# Patient Record
Sex: Female | Born: 1990 | Race: Black or African American | Hispanic: No | Marital: Single | Smoking: Never smoker
Health system: Southern US, Community
[De-identification: ages and names within clinical notes are randomized; demographics above are authoritative.]

## PROBLEM LIST (undated history)

## (undated) DIAGNOSIS — N760 Acute vaginitis: Secondary | ICD-10-CM

## (undated) DIAGNOSIS — Z8742 Personal history of other diseases of the female genital tract: Secondary | ICD-10-CM

## (undated) DIAGNOSIS — B9689 Other specified bacterial agents as the cause of diseases classified elsewhere: Secondary | ICD-10-CM

## (undated) DIAGNOSIS — Z8619 Personal history of other infectious and parasitic diseases: Secondary | ICD-10-CM

## (undated) DIAGNOSIS — N926 Irregular menstruation, unspecified: Secondary | ICD-10-CM

## (undated) DIAGNOSIS — L680 Hirsutism: Secondary | ICD-10-CM

## (undated) DIAGNOSIS — N898 Other specified noninflammatory disorders of vagina: Secondary | ICD-10-CM

## (undated) DIAGNOSIS — E282 Polycystic ovarian syndrome: Secondary | ICD-10-CM

## (undated) HISTORY — DX: Polycystic ovarian syndrome: E28.2

## (undated) HISTORY — DX: Personal history of other diseases of the female genital tract: Z87.42

## (undated) HISTORY — DX: Hirsutism: L68.0

## (undated) HISTORY — DX: Other specified bacterial agents as the cause of diseases classified elsewhere: B96.89

## (undated) HISTORY — DX: Irregular menstruation, unspecified: N92.6

## (undated) HISTORY — DX: Acute vaginitis: N76.0

## (undated) HISTORY — DX: Personal history of other infectious and parasitic diseases: Z86.19

## (undated) HISTORY — DX: Other specified noninflammatory disorders of vagina: N89.8

---
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
## 2012-05-03 ENCOUNTER — Emergency Department (HOSPITAL_COMMUNITY)
Admission: EM | Admit: 2012-05-03 | Discharge: 2012-05-04 | Disposition: A | Discharge status: Home / Self Care | Payer: Federal, State, Local not specified - PPO | Attending: Emergency Medicine | Admitting: Emergency Medicine

## 2012-05-03 ENCOUNTER — Encounter (HOSPITAL_COMMUNITY): Payer: Self-pay | Admitting: *Deleted

## 2012-05-03 DIAGNOSIS — Z8742 Personal history of other diseases of the female genital tract: Secondary | ICD-10-CM | POA: Insufficient documentation

## 2012-05-03 DIAGNOSIS — R599 Enlarged lymph nodes, unspecified: Secondary | ICD-10-CM | POA: Insufficient documentation

## 2012-05-03 DIAGNOSIS — Z8679 Personal history of other diseases of the circulatory system: Secondary | ICD-10-CM | POA: Insufficient documentation

## 2012-05-03 DIAGNOSIS — R1310 Dysphagia, unspecified: Secondary | ICD-10-CM | POA: Insufficient documentation

## 2012-05-03 DIAGNOSIS — R000 Tachycardia, unspecified: Secondary | ICD-10-CM | POA: Insufficient documentation

## 2012-05-03 DIAGNOSIS — L539 Erythematous condition, unspecified: Secondary | ICD-10-CM | POA: Insufficient documentation

## 2012-05-03 DIAGNOSIS — J029 Acute pharyngitis, unspecified: Secondary | ICD-10-CM

## 2012-05-03 DIAGNOSIS — R6889 Other general symptoms and signs: Secondary | ICD-10-CM | POA: Insufficient documentation

## 2012-05-03 DIAGNOSIS — R498 Other voice and resonance disorders: Secondary | ICD-10-CM | POA: Insufficient documentation

## 2012-05-03 NOTE — ED Notes (Signed)
Pt in c/o sore throat since Saturday, denies fever.

## 2012-05-03 NOTE — ED Notes (Addendum)
Pt in with complaints sore throat x 2 days, pt states nurse at work examined throat and discovered white patches on throat. Pt states it hurts really bad to swallow.  No fever, cough, or runny nose. Lungs clear.

## 2012-05-04 NOTE — ED Provider Notes (Signed)
History     CSN: [id number]  Arrival date & time 05/03/12  2339   First MD Initiated Contact with Patient 05/04/12 0017      Chief Complaint  Patient presents with  . Sore Throat    (Consider location/radiation/quality/duration/timing/severity/associated sxs/prior treatment) HPI Comments: Patient states she's had a sore throat for the past 3, days.  Denies any fever.  She, states she has difficulty swallowing.  [place], reports, she has a foul odor to her breath.  Denies any ill contacts.  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 female presenting with pharyngitis. The history is provided by the patient.  Sore Throat This is a new problem. The current episode started in the past 7 days. The problem has been gradually worsening. Associated symptoms include a sore throat. Pertinent negatives include no chills, fever, headaches, nausea or weakness.    Past Medical History  Diagnosis Date  . PCOS (polycystic ovarian syndrome)   . Hirsutism   . H/O varicella   . Leukorrhea 2004  . Hx of vaginal discharge 2004  . H/O dysmenorrhea 2010  . BV (bacterial vaginosis) 2012  . Irregular menstrual bleeding 07/28/11  . Vaginal odor 09/11/11    History reviewed. No pertinent past surgical history.  Family History  Problem Relation Age of Onset  . Diabetes Maternal Grandmother   . Kidney disease Mother     History  Substance Use Topics  . Smoking status: Never Smoker   . Smokeless tobacco: Never Used  . Alcohol Use: No    OB History    Grav Para Term Preterm Abortions TAB SAB Ect Mult Living   0         0      Review of Systems  Constitutional: Negative for fever and chills.  HENT: Positive for sore throat and voice change.   Gastrointestinal: Negative for nausea.  Neurological: Negative for weakness and headaches.    Allergies  Review of patient's allergies indicates no known allergies.  Home  Medications   Current Outpatient Rx  Name  Route  Sig  Dispense  Refill  . ALUMINUM CHLORIDE 20 % EX SOLN   Topical   Apply 1 application topically at bedtime. Apply to armpits         . LEVONORGESTREL 20 MCG/24HR IU IUD   Intrauterine   1 each by Intrauterine route once.           BP 121/70  Pulse 103  Temp 98.2 F (36.8 C) (Oral)  Resp 16  SpO2 100%  Physical Exam  Constitutional: She appears well-developed and well-nourished.  HENT:  Head: Normocephalic.  Mouth/Throat: Oropharyngeal exudate and posterior oropharyngeal erythema present.  Neck: Normal range of motion.  Cardiovascular: Tachycardia present.   Pulmonary/Chest: Effort normal.  Abdominal: Soft.  Musculoskeletal: Normal range of motion.  Lymphadenopathy:    She has cervical adenopathy.  Neurological: She is alert.  Skin: Skin is warm.    ED Course  Procedures (including critical care time)   Labs Reviewed  RAPID STREP SCREEN   No results found.   1. Pharyngitis       MDM   Will obtain rapid strep        Arman Filter, NP 05/04/12 0121

## 2012-05-04 NOTE — ED Provider Notes (Signed)
Medical screening examination/treatment/procedure(s) were performed by non-physician practitioner and as supervising physician I was immediately available for consultation/collaboration.   Hanley Seamen, MD 05/04/12 [phone]

## 2020-01-04 ENCOUNTER — Ambulatory Visit: Payer: Self-pay | Admitting: Nurse Practitioner

## 2021-03-27 ENCOUNTER — Emergency Department (HOSPITAL_BASED_OUTPATIENT_CLINIC_OR_DEPARTMENT_OTHER): Payer: Self-pay

## 2021-03-27 ENCOUNTER — Other Ambulatory Visit: Payer: Self-pay

## 2021-03-27 ENCOUNTER — Encounter (HOSPITAL_BASED_OUTPATIENT_CLINIC_OR_DEPARTMENT_OTHER): Payer: Self-pay

## 2021-03-27 DIAGNOSIS — Y990 Civilian activity done for income or pay: Secondary | ICD-10-CM | POA: Insufficient documentation

## 2021-03-27 DIAGNOSIS — W2209XA Striking against other stationary object, initial encounter: Secondary | ICD-10-CM | POA: Insufficient documentation

## 2021-03-27 DIAGNOSIS — S60031A Contusion of right middle finger without damage to nail, initial encounter: Secondary | ICD-10-CM | POA: Insufficient documentation

## 2021-03-27 NOTE — ED Triage Notes (Signed)
Pt injured right middle finger at work ~340pm-slight lac and bruising noted-4x4/kling dsg applied

## 2021-03-28 ENCOUNTER — Emergency Department (HOSPITAL_BASED_OUTPATIENT_CLINIC_OR_DEPARTMENT_OTHER)
Admission: EM | Admit: 2021-03-28 | Discharge: 2021-03-28 | Disposition: A | Discharge status: Home / Self Care | Payer: Self-pay | Attending: Emergency Medicine | Admitting: Emergency Medicine

## 2021-03-28 DIAGNOSIS — S60031A Contusion of right middle finger without damage to nail, initial encounter: Secondary | ICD-10-CM

## 2021-03-28 NOTE — Discharge Instructions (Signed)
Seen in the emergency room today with right middle finger pain and abrasion.  Your x-ray did not show any broken bones.  Please keep the scrape on your finger clean and dry.  Do not pop the blood blister on the underside of your finger.  Return to the emergency department in your Sunday worsening symptoms.

## 2021-03-28 NOTE — ED Provider Notes (Signed)
Emergency Department Provider Note   I have reviewed the triage vital signs and the nursing notes.   HISTORY  Chief Complaint Finger Injury   HPI Samantha Hicks is a 30 y.o. female with past medical history reviewed below presents to the emergency department with finger injury while at work.  She was working when she hit her finger against a metal bar.  There was no crush injury.  She is having pain on the underside of the finger with some blood blistering but no active bleeding.  She also has an abrasion to the top aspect of the right middle finger.  She has preserved range of motion of the finger and no pain in the palm of the hand, wrist, forearm.  No numbness or tingling.  Past Medical History:  Diagnosis Date   BV (bacterial vaginosis) 2012   H/O dysmenorrhea 2010   H/O varicella    Hirsutism    Hx of vaginal discharge 2004   Irregular menstrual bleeding 07/28/11   Leukorrhea 2004   PCOS (polycystic ovarian syndrome)    Vaginal odor 09/11/11    Patient Active Problem List   Diagnosis Date Noted   PCOS (polycystic ovarian syndrome)    Hirsutism    ALLERGIC RHINITIS 02/26/2009    History reviewed. No pertinent surgical history.  Allergies Patient has no known allergies.  Family History  Problem Relation Age of Onset   Diabetes Maternal Grandmother    Kidney disease Mother     Social History Social History   Tobacco Use   Smoking status: Never   Smokeless tobacco: Never  Substance Use Topics   Alcohol use: Yes    Comment: occ   Drug use: No    Review of Systems  Constitutional: No fever/chills Eyes: No visual changes. ENT: No sore throat. Cardiovascular: Denies chest pain. Respiratory: Denies shortness of breath. Gastrointestinal: No abdominal pain.  No nausea, no vomiting.  No diarrhea.  No constipation. Genitourinary: Negative for dysuria. Musculoskeletal: Negative for back pain. Positive right middle finger.  Skin: Negative for rash.  Positive abrasion to the right middle finger.  Neurological: Negative for headaches, focal weakness or numbness.  10-point ROS otherwise negative.  ____________________________________________   PHYSICAL EXAM:  VITAL SIGNS: ED Triage Vitals  Enc Vitals Group     BP 03/27/21 1858 125/87     Pulse Rate 03/27/21 1858 93     Resp 03/27/21 1858 18     Temp 03/27/21 1858 98.4 F (36.9 C)     Temp Source 03/27/21 1858 Oral     SpO2 03/27/21 1858 100 %     Weight 03/27/21 1859 162 lb (73.5 kg)     Height 03/27/21 1859 5\' 1"  (1.549 m)   Constitutional: Alert and oriented. Well appearing and in no acute distress. Eyes: Conjunctivae are normal.  Head: Atraumatic. Neck: No stridor. Cardiovascular: Good peripheral circulation Respiratory: Normal respiratory effort.  Gastrointestinal: No distention.  Musculoskeletal: Mild swelling over the palmar aspect of the right middle finger with focal area of blood blister.  No active bleeding.  Normal range of motion of the finger.  Faint abrasion to the dorsal aspect of the right middle finger without laceration.  Neurologic:  Normal speech and language. No gross focal neurologic deficits are appreciated.  Skin:  Skin is warm and dry.   ____________________________________________  RADIOLOGY  DG Finger Middle Right  Result Date: 03/27/2021 CLINICAL DATA:  Injury EXAM: RIGHT MIDDLE FINGER 2+V COMPARISON:  None. FINDINGS: There is no  acute osseous abnormality. There is no significant arthropathy. Normal alignment. There is mild soft tissue swelling. IMPRESSION: No acute osseous abnormality.  Mild soft tissue swelling. Electronically Signed   By: Caprice Renshaw M.D.   On: 03/27/2021 19:36    ____________________________________________   PROCEDURES  Procedure(s) performed:   Procedures  None  ____________________________________________   INITIAL IMPRESSION / ASSESSMENT AND PLAN / ED COURSE  Pertinent labs & imaging results that were  available during my care of the patient were reviewed by me and considered in my medical decision making (see chart for details).   Patient presents emergency department right middle finger injury while at work.  No laceration requiring repair.  I cleaned the area with Betadine.  Patient does have some blood blistering on the palmar aspect of the right middle finger but this is not restricting her range of motion.  She will leave the area intact.  Will take Tylenol/Motrin as needed for pain.  Return with any worsening finger swelling, redness, drainage, fever.    ____________________________________________  FINAL CLINICAL IMPRESSION(S) / ED DIAGNOSES  Final diagnoses:  Contusion of right middle finger without damage to nail, initial encounter     Note:  This document was prepared using Dragon voice recognition software and may include unintentional dictation errors.  Alona Bene, MD, [facility]    Tammara Massing, Arlyss Repress, MD 03/30/21 [phone]

## 2022-11-02 IMAGING — DX DG FINGER MIDDLE 2+V*R*
3 series · 3 of 3 positions shown · non-contrast
Comparison: None.

CLINICAL DATA: Injury

EXAM:
RIGHT MIDDLE FINGER 2+V

[finger ap]
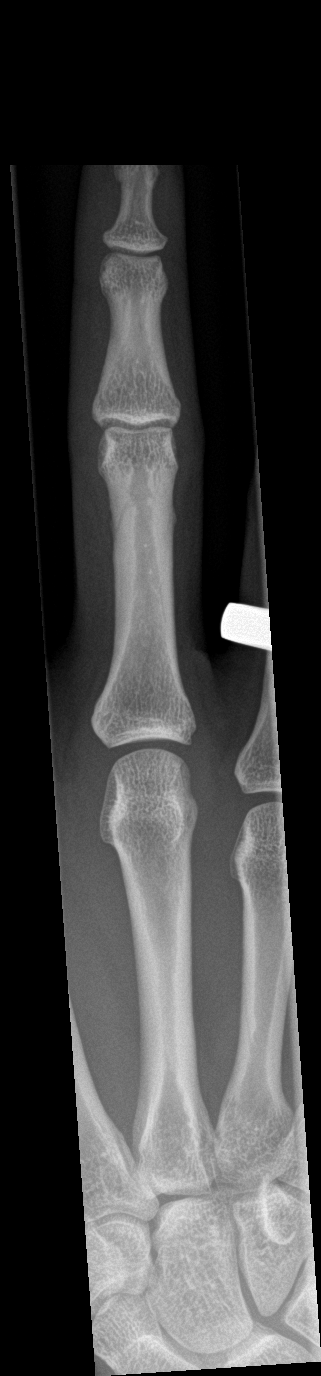

[finger obl]
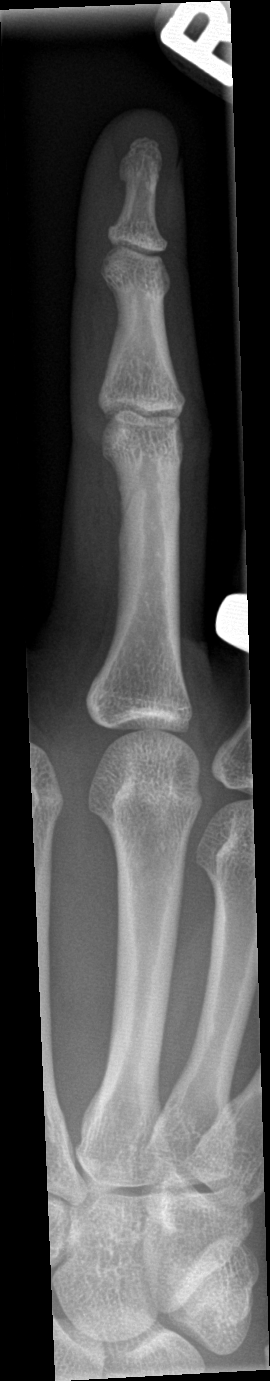

[finger lat]
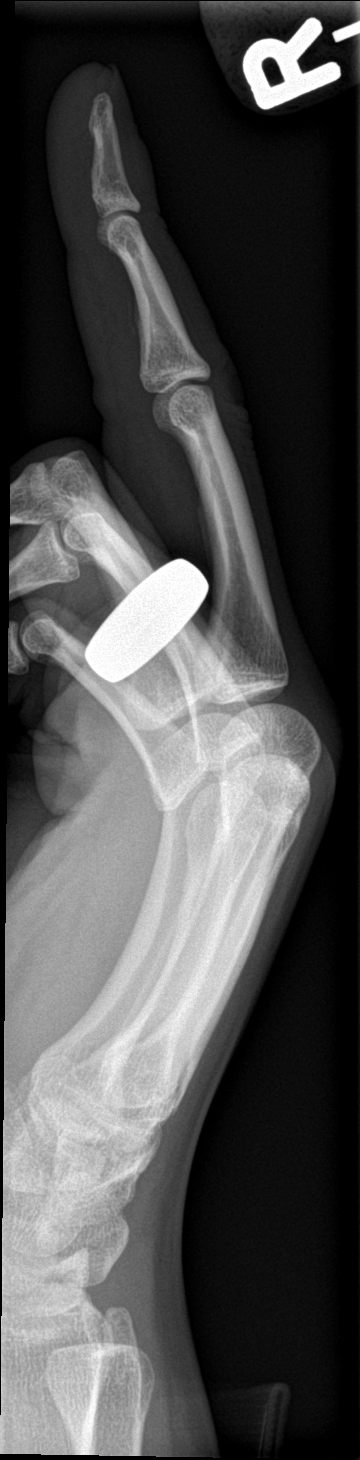

[3 of 3 positions shown; findings below may reference images not displayed]

FINDINGS: There is no acute osseous abnormality. There is no significant
arthropathy. Normal alignment. There is mild soft tissue swelling.
IMPRESSION: No acute osseous abnormality.  Mild soft tissue swelling.
